# Patient Record
Sex: Female | Born: 1985 | Hispanic: Yes | Marital: Single | Smoking: Never smoker
Health system: Southern US, Community
[De-identification: ages and names within clinical notes are randomized; demographics above are authoritative.]

## PROBLEM LIST (undated history)

## (undated) DIAGNOSIS — D649 Anemia, unspecified: Secondary | ICD-10-CM

---

## 2010-06-14 ENCOUNTER — Ambulatory Visit: Payer: Self-pay | Admitting: Urgent Care

## 2010-06-19 ENCOUNTER — Encounter: Payer: Self-pay | Admitting: Urgent Care

## 2010-06-19 ENCOUNTER — Ambulatory Visit (INDEPENDENT_AMBULATORY_CARE_PROVIDER_SITE_OTHER): Payer: BC Managed Care – PPO | Admitting: Urgent Care

## 2010-06-19 DIAGNOSIS — K5900 Constipation, unspecified: Secondary | ICD-10-CM | POA: Insufficient documentation

## 2010-06-19 DIAGNOSIS — R109 Unspecified abdominal pain: Secondary | ICD-10-CM | POA: Insufficient documentation

## 2010-06-19 MED ORDER — LUBIPROSTONE 8 MCG PO CAPS: 8.0000 ug | ORAL_CAPSULE | Freq: Two times a day (BID) | ORAL | Status: AC

## 2010-06-19 NOTE — Progress Notes (Signed)
Primary Care Physician:  [facility] Primary Gastroenterologist:  Dr. Darrick Penna  Chief Complaint  Patient presents with  . Constipation   Interpreter:  [place] [phone] HPI:  Pam Carter is a 25 y.o. female here as a new patient for further evaluation of constipation.   Symptoms x 6-7 mo.  Used to have daily BM.  Now has abd pain, but no BM. Pain better w/ defecation.  Pain mid abd daily 4/10.  Intermittent.  C/o hard stools, very little.  Denies rectal bleeding or melena.  Wt stable.  Tried OTC fiber supplements-helped a little.  Tried miralax 17 grams daily x 1 mo.  C/o nausea,  no vomiting.  Some heartburn or indigestion depending on what she eats esp. Spicy foods.  Appetite ok.  Urine preg test negative @home .  Cycles irreg & a few days late.  LMP started 4 days ago.    Past Medical History  Diagnosis Date  . Constipation     Past Surgical History  Procedure Date  . Cesarean section     x1    Current Outpatient Prescriptions  Medication Sig Dispense Refill  . folic acid (FOLVITE) 1 MG tablet Take 1 mg by mouth daily.        Marland Kitchen lubiprostone (AMITIZA) 8 MCG capsule Take 1 capsule (8 mcg total) by mouth 2 (two) times daily with a meal.  60 capsule  1  . multivitamin-iron-minerals-folic acid (CENTRUM) chewable tablet Chew 1 tablet by mouth daily.          Allergies as of 06/19/2010  . (No Known Allergies)    Family History: There is no known family history of colorectal carcinoma  or liver disease.    Problem Relation Age of Onset  . Colitis Mother 27  . Diabetes Father     History   Social History  . Marital Status: Married    Spouse Name: N/A    Number of Children: 1  . Years of Education: N/A   Occupational History  . pastic recycling    Social History Main Topics  . Smoking status: Never Smoker   . Smokeless tobacco: Not on file  . Alcohol Use: No  . Drug Use: No  . Sexually Active: Yes -- Female partner(s)    Birth Control/ Protection: None    Other Topics Concern  . Not on file   Social History Narrative  . No narrative on file    Review of Systems: Gen: Denies any fever, chills, sweats, anorexia, fatigue, weakness, malaise, weight loss, and sleep disorder CV: Denies chest pain, angina, palpitations, syncope, orthopnea, PND, peripheral edema, and claudication. Resp: Denies dyspnea at rest, dyspnea with exercise, cough, sputum, wheezing, coughing up blood, and pleurisy. GI: Denies vomiting blood, jaundice, and fecal incontinence.   Denies dysphagia or odynophagia. GU : Denies urinary burning, blood in urine, urinary frequency, urinary hesitancy, nocturnal urination, and urinary incontinence. MS: Denies joint pain, limitation of movement, and swelling, stiffness, low back pain, extremity pain. Denies muscle weakness, cramps, atrophy.  Derm:Rash arms.stomach, legs x 3-4 mo.  Denies pruritis.  Psych: Denies depression, anxiety, memory loss, suicidal ideation, hallucinations, paranoia, and confusion. Heme: Denies bruising, bleeding, and enlarged lymph nodes.  Physical Exam: BP 98/64  Pulse 76  Temp(Src) 98.1 F (36.7 C) (Temporal)  Ht 5' (1.524 m)  Wt 125 lb 12.8 oz (57.063 kg)  BMI 24.57 kg/m2  LMP 06/18/2010 General:   Alert,  Well-developed, well-nourished, pleasant and cooperative in NAD Head:  Normocephalic and  atraumatic. Eyes:  Sclera clear, no icterus.   Conjunctiva pink. Ears:  Normal auditory acuity. Nose:  No deformity, discharge,  or lesions. Mouth:  No deformity or lesions, dentition normal. Neck:  Supple; no masses or thyromegaly. Lungs:  Clear throughout to auscultation.   No wheezes, crackles, or rhonchi. No acute distress. Heart:  Regular rate and rhythm; no murmurs, clicks, rubs,  or gallops. Abdomen:  Multiple striae.  Soft, nontender and nondistended. No masses, hepatosplenomegaly or hernias noted. Normal bowel sounds, without guarding, and without rebound.   Rectal:  Deferred until time of  colonoscopy.   Msk:  Symmetrical without gross deformities. Normal posture. Pulses:  Normal pulses noted. Extremities:  Without clubbing or edema. Neurologic:  Alert and  oriented x4;  grossly normal neurologically. Skin:  Intact without significant lesions or rashes. Cervical Nodes:  No significant cervical adenopathy. Psych:  Alert and cooperative. Normal mood and affect.

## 2010-06-19 NOTE — Patient Instructions (Signed)
Drink plenty water Go to lab today Constipacin en los adultos (Constipation in Adults) Se denomina constipacin al hecho de mover el intestino menos de dos veces por semana. Generalmente las heces son duras. A medida que envejecemos, la constipacin es ms frecuente. Si trata de solucionarlo con laxantes, puede empeorar el problema. Los laxantes utilizados durante largos perodos pueden [employer] msculos del colon. Esto har que la constipacin empeore gradualmente. Una dieta pobre en fibras, la ingesta insuficiente de lquidos y algunos medicamentos pueden empeorar el problema. ALGUNOS MEDICAMENTOS QUE PUEDEN CAUSAR CONSTIPACIN SON:  Diurticos.  Boqueadotes de los canales de calcio (medicamento utilizado para Chief Operating Officer la presin arterial y el funcionamiento cardaco.   Narcticos (ciertos medicamentos para Chief Technology Officer).   Anticolinrgicos.   Antiinflamatorios.   Anticidos que contengan aluminio.   ALGUNOS MEDICAMENTOS QUE PUEDEN CAUSAR CONSTIPACIN SON:   Diabetes.  Enfermedad de Parkinson.   Demencia (la mente no funciona adecuadamente y existen trastornos del pensamiento).   Ictus.  Depresin.   Otras enfermedades que causan dificultades con el metabolismo de sales y lquidos.   INSTRUCCIONES PARA EL CUIDADO DOMICILIARIO  El mejor tratamiento para la constipacin es el que se realiza sin tomar medicamentos. Es importante consumir ms fibras, frutas y Sports administrator.   Aumente lentamente el consumo de fibras a 25 a 38 gramos por da. Granos integrales, frutas, verduras y legumbres son buenas fuentes de [place]. Un dietista podr ayudarlo a incorporar alimentos altos en fibra en su dieta.   Beba gran cantidad de lquido para mantener la orina de tono claro o color amarillo plido.   Deber aadir un suplemento de fibra en su dieta si no puede recibir la cantidad suficiente a partir de los alimentos.   Aumentar la actividad fsica tambin ayuda a mejorar la regularidad.   Los  supositorios, segn lo haya indicado el mdico, podrn ser de utilidad. Si toma anticidos u otros productos que contengan aluminio o calcio, los que pueden causar constipacin, ser de gran ayuda cambiarlos por productos que contengan magnesio, con la aprobacin del mdico.   Si en el da de hoy le han aplicado un enema, considere que esta slo es una medida temporaria y no debe considerarse como tratamiento para la constipacin de Set designer data (crnica). Si utiliza enemas [employer], se debilitarn los msculos del colon y la Quarry manager.   Tambin deben evitarse en lo posible medidas ms enrgicas, como el consumo de sulfato de magnesio, siempre que sea posible. El sulfato de magnesio puede causar diarrea incontrolable. Sin embargo, si usted es una persona de edad Wheeler, estas medidas pueden ser tentadoras. En algunos casos, este tipo de medidas ni siquiera le dan tiempo para llegar al bao.  SOLICITE ATENCIN MDICA DE INMEDIATO SI:  Observa sangre de color rojo brillante en las heces.   El estreimiento persiste durante ms de [employer]   Presenta dolor abdominal o rectal junto con el estreimiento.   No parece sentirse mejor.   Tiene preguntas para formular, o alguna preocupacin, o no mejora.  EST SEGURO QUE:   Comprende las instrucciones para el alta mdica.   Controlar su enfermedad.   Solicitar atencin mdica de inmediato segn las indicaciones.  Document Released: 01/19/2007 Document Re-Released: 06/19/2009 [facility] Patient Information 2011 [place], [place].

## 2010-06-19 NOTE — Assessment & Plan Note (Addendum)
Pam Carter is a 25 y.o. hispanic female w/ chronic constipation and abd pain.  I suspect pain is secondary to constipation/IBS.  Less likely evolving appendicitis or celiac disease.  Will check thyroid, CMP, & CBC.  No alarm features suggesting need for colonoscopy at this point.  Constipation precautions/literature Drink plenty water Check u preg If negative, begin amitiza BID w/ food. Pt would like to conceive, she is instructed to stop amitiza should she become pregnant. Previous failure w/ miralax. Office visit in 6 weeks to reassess or call sooner if needed.

## 2010-06-19 NOTE — Assessment & Plan Note (Signed)
See constipation 

## 2010-06-20 LAB — COMPREHENSIVE METABOLIC PANEL
AST: 18 U/L (ref 0–37)
Albumin: 4.4 g/dL (ref 3.5–5.2)
Alkaline Phosphatase: 77 U/L (ref 39–117)
BUN: 14 mg/dL (ref 6–23)
Creat: 0.6 mg/dL (ref 0.50–1.10)
Glucose, Bld: 62 mg/dL — ABNORMAL LOW (ref 70–99)
Potassium: 3.9 mEq/L (ref 3.5–5.3)
Total Bilirubin: 0.4 mg/dL (ref 0.3–1.2)

## 2010-06-20 LAB — CBC WITH DIFFERENTIAL/PLATELET
Basophils Relative: 1 % (ref 0–1)
Eosinophils Absolute: 0.3 10*3/uL (ref 0.0–0.7)
HCT: 37.7 % (ref 36.0–46.0)
Hemoglobin: 12.7 g/dL (ref 12.0–15.0)
Lymphs Abs: 1.8 10*3/uL (ref 0.7–4.0)
MCH: 30.1 pg (ref 26.0–34.0)
MCHC: 33.7 g/dL (ref 30.0–36.0)
MCV: 89.3 fL (ref 78.0–100.0)
Monocytes Absolute: 0.3 10*3/uL (ref 0.1–1.0)
Monocytes Relative: 7 % (ref 3–12)
Neutrophils Relative %: 53 % (ref 43–77)
RBC: 4.22 MIL/uL (ref 3.87–5.11)

## 2010-06-20 LAB — PREGNANCY, URINE: Preg Test, Ur: NEGATIVE

## 2010-06-20 NOTE — Progress Notes (Signed)
Cc to [facility] 

## 2010-06-21 NOTE — Progress Notes (Signed)
Cc to [facility] 

## 2010-07-09 NOTE — Progress Notes (Signed)
agree

## 2018-06-22 ENCOUNTER — Other Ambulatory Visit: Payer: Self-pay

## 2018-06-22 ENCOUNTER — Emergency Department (HOSPITAL_COMMUNITY): Payer: 59

## 2018-06-22 ENCOUNTER — Encounter (HOSPITAL_COMMUNITY): Payer: Self-pay

## 2018-06-22 ENCOUNTER — Emergency Department (HOSPITAL_COMMUNITY)
Admission: EM | Admit: 2018-06-22 | Discharge: 2018-06-22 | Disposition: A | Discharge status: Home / Self Care | Payer: 59 | Attending: Emergency Medicine | Admitting: Emergency Medicine

## 2018-06-22 DIAGNOSIS — R0789 Other chest pain: Secondary | ICD-10-CM | POA: Diagnosis not present

## 2018-06-22 DIAGNOSIS — R002 Palpitations: Secondary | ICD-10-CM | POA: Diagnosis not present

## 2018-06-22 DIAGNOSIS — Z79899 Other long term (current) drug therapy: Secondary | ICD-10-CM | POA: Insufficient documentation

## 2018-06-22 DIAGNOSIS — R079 Chest pain, unspecified: Secondary | ICD-10-CM | POA: Diagnosis present

## 2018-06-22 HISTORY — DX: Anemia, unspecified: D64.9

## 2018-06-22 LAB — BASIC METABOLIC PANEL
Anion gap: 9 (ref 5–15)
BUN: 12 mg/dL (ref 6–20)
CO2: 25 mmol/L (ref 22–32)
Calcium: 9.2 mg/dL (ref 8.9–10.3)
Chloride: 105 mmol/L (ref 98–111)
Creatinine, Ser: 0.54 mg/dL (ref 0.44–1.00)
GFR calc Af Amer: >60 mL/min (ref >60)
GFR calc non Af Amer: >60 mL/min (ref >60)
Glucose, Bld: 103 mg/dL — ABNORMAL HIGH (ref 70–99)
Potassium: 3.9 mmol/L (ref 3.5–5.1)
Sodium: 139 mmol/L (ref 135–145)

## 2018-06-22 LAB — CBC
HCT: 40 % (ref 36.0–46.0)
Hemoglobin: 13 g/dL (ref 12.0–15.0)
MCH: 30.3 pg (ref 26.0–34.0)
MCHC: 32.5 g/dL (ref 30.0–36.0)
MCV: 93.2 fL (ref 80.0–100.0)
Platelets: 251 10*3/uL (ref 150–400)
RBC: 4.29 MIL/uL (ref 3.87–5.11)
RDW: 11.3 % — ABNORMAL LOW (ref 11.5–15.5)
WBC: 8.1 10*3/uL (ref 4.0–10.5)
nRBC: 0 % (ref 0.0–0.2)

## 2018-06-22 LAB — TROPONIN I
Troponin I: <0.03 ng/mL (ref <0.03)
Troponin I: <0.03 ng/mL (ref <0.03)

## 2018-06-22 LAB — D-DIMER, QUANTITATIVE: D-Dimer, Quant: 0.9 ug/mL-FEU — ABNORMAL HIGH (ref 0.00–0.50)

## 2018-06-22 LAB — TSH: TSH: 1.611 u[IU]/mL (ref 0.350–4.500)

## 2018-06-22 MED ORDER — SODIUM CHLORIDE 0.9% FLUSH
3.0000 mL | Freq: Once | INTRAVENOUS | Status: AC
  Administered 2018-06-22: 3 mL via INTRAVENOUS

## 2018-06-22 MED ORDER — IOHEXOL 350 MG/ML SOLN
100.0000 mL | Freq: Once | INTRAVENOUS | Status: AC | PRN
  Administered 2018-06-22: 100 mL via INTRAVENOUS

## 2018-06-22 MED ORDER — MORPHINE SULFATE (PF) 2 MG/ML IV SOLN
2.0000 mg | Freq: Once | INTRAVENOUS | Status: AC
  Administered 2018-06-22: 17:00:00 2 mg via INTRAVENOUS
  Filled 2018-06-22: qty 1

## 2018-06-22 NOTE — ED Provider Notes (Signed)
[facility] Provider Note   CSN: [id number] Arrival date & time: 06/22/18  1108    History   Chief Complaint Chief Complaint  Patient presents with  . Chest Pain    HPI Pam Carter is a 33 y.o. female with history of anemia and constipation presents for evaluation of acute onset, persistent palpitations and right-sided chest pains.  She reports a dull pain to the right side of the chest with worsens with inspiration.  Symptoms began at around 9:45 AM after drinking coffee and eating crackers while on her break at work.  She reports feeling very shaky, palpitations, some shortness of breath and right-sided chest pain.  She felt as though her legs were weak but this has resolved.  She reports the palpitations and right-sided chest pains persist intermittently.  She is a non-smoker, denies recreational drug use or excessive alcohol intake.  She denies excessive caffeine intake as well.  She does report a history of thyroid issues and takes medication for this as well as medication for her anemia.  Reports she has never had symptoms like this before.  Denies recent travel or surgeries, hemoptysis, prior history of DVT or PE.  Patient is primarily Spanish-speaking and a translator was used throughout the encounter.     The history is provided by the patient. The history is limited by a language barrier. A language interpreter was used.    Past Medical History:  Diagnosis Date  . Anemia   . Constipation     Patient Active Problem List   Diagnosis Date Noted  . Constipation 06/19/2010  . Abdominal pain 06/19/2010    Past Surgical History:  Procedure Laterality Date  . CESAREAN SECTION     x1     OB History   No obstetric history on file.      Home Medications    Prior to Admission medications   Medication Sig Start Date End Date Taking? Authorizing Provider  Cyanocobalamin (B-12) 5000 MCG CAPS Take 1 tablet by mouth daily.   Yes [provider]   ferrous sulfate 325 (65 FE) MG tablet Take 1 tablet by mouth daily. 05/24/18  Yes [provider]  levothyroxine (SYNTHROID) 50 MCG tablet Take 1 tablet by mouth daily. 05/24/18  Yes [provider]    Family History Family History  Problem Relation Age of Onset  . Colitis Mother 4652  . Diabetes Father     Social History Social History   Tobacco Use  . Smoking status: Never Smoker  . Smokeless tobacco: Never Used  Substance Use Topics  . Alcohol use: No  . Drug use: No     Allergies   Patient has no known allergies.   Review of Systems Review of Systems  Constitutional: Negative for chills and fever.  Respiratory: Positive for cough and shortness of breath.   Cardiovascular: Positive for chest pain. Negative for leg swelling.  Gastrointestinal: Negative for abdominal pain, nausea and vomiting.  All other systems reviewed and are negative.    Physical Exam Updated Vital Signs BP 114/72   Pulse 88   Temp 98.7 F (37.1 C) (Oral)   Resp 18   Ht 4\' 10"  (1.473 m)   Wt 63.5 kg   LMP 06/10/2018   SpO2 100%   BMI 29.26 kg/m   Physical Exam Vitals signs and nursing note reviewed.  Constitutional:      General: She is not in acute distress.    Appearance: She is well-developed.  HENT:     Head: Normocephalic and atraumatic.  Eyes:     General:        Right eye: No discharge.        Left eye: No discharge.     Conjunctiva/sclera: Conjunctivae normal.  Neck:     Musculoskeletal: Normal range of motion and neck supple.     Vascular: No JVD.     Trachea: No tracheal deviation.  Cardiovascular:     Rate and Rhythm: Normal rate and regular rhythm.     Pulses:          Radial pulses are 2+ on the right side and 2+ on the left side.       Dorsalis pedis pulses are 2+ on the right side and 2+ on the left side.       Posterior tibial pulses are 2+ on the right side and 2+ on the left side.  Pulmonary:     Effort: Pulmonary effort is normal.   Chest:     Chest wall: No tenderness.  Abdominal:     General: There is no distension.  Musculoskeletal:     Right lower leg: She exhibits no tenderness. No edema.     Left lower leg: She exhibits no tenderness. No edema.  Skin:    General: Skin is warm and dry.     Findings: No erythema.  Neurological:     Mental Status: She is alert.  Psychiatric:        Behavior: Behavior normal.      ED Treatments / Results  Labs (all labs ordered are listed, but only abnormal results are displayed) Labs Reviewed  BASIC METABOLIC PANEL - Abnormal; Notable for the following components:      Result Value   Glucose, Bld 103 (*)    All other components within normal limits  CBC - Abnormal; Notable for the following components:   RDW 11.3 (*)    All other components within normal limits  D-DIMER, QUANTITATIVE (NOT AT [facility]) - Abnormal; Notable for the following components:   D-Dimer, Quant 0.90 (*)    All other components within normal limits  TROPONIN I  TROPONIN I  TSH  POC URINE PREG, ED  I-STAT BETA HCG BLOOD, ED ([facility], [facility], [facility] ONLY)    EKG EKG Interpretation  Date/Time:  Tuesday June 22 2018 15:41:43 EDT Ventricular Rate:  79 PR Interval:    QRS Duration: 77 QT Interval:  382 QTC Calculation: 438 R Axis:   67 Text Interpretation:  Sinus rhythm No STEMI  Confirmed by Nanda Quinton [phone]) on 06/22/2018 7:27:36 PM   Radiology Dg Chest 2 View  Result Date: 06/22/2018 CLINICAL DATA:  Right-sided chest pain EXAM: CHEST - 2 VIEW COMPARISON:  None. FINDINGS: The heart size and mediastinal contours are within normal limits. Both lungs are clear. The visualized skeletal structures are unremarkable. IMPRESSION: No active cardiopulmonary disease. Electronically Signed   By: Inez Catalina M.D.   On: 06/22/2018 12:33   Ct Angio Chest Pe W And/or Wo Contrast  Result Date: 06/22/2018 CLINICAL DATA:  Positive D-dimer.  Tachycardia. EXAM: CT ANGIOGRAPHY CHEST WITH CONTRAST TECHNIQUE:  Multidetector CT imaging of the chest was performed using the standard protocol during bolus administration of intravenous contrast. Multiplanar CT image reconstructions and MIPs were obtained to evaluate the vascular anatomy. CONTRAST:  145mL OMNIPAQUE IOHEXOL 350 MG/ML SOLN COMPARISON:  Radiographs of same day. FINDINGS: Cardiovascular: Satisfactory opacification of the pulmonary arteries to the segmental level. No evidence  of pulmonary embolism. Normal heart size. No pericardial effusion. There is no evidence of thoracic aortic dissection or aneurysm. Mediastinum/Nodes: No enlarged mediastinal, hilar, or axillary lymph nodes. Thyroid gland, trachea, and esophagus demonstrate no significant findings. Lungs/Pleura: Lungs are clear. No pleural effusion or pneumothorax. Upper Abdomen: No acute abnormality. Musculoskeletal: No chest wall abnormality. No acute or significant osseous findings. Review of the MIP images confirms the above findings. IMPRESSION: No definite evidence of pulmonary embolus. No significant abnormality seen in the chest. Electronically Signed   By: Lupita RaiderJames  Green Jr M.D.   On: 06/22/2018 18:54    Procedures Procedures (including critical care time)  Medications Ordered in ED Medications  sodium chloride flush (NS) 0.9 % injection 3 mL (3 mLs Intravenous Given 06/22/18 1820)  morphine 2 MG/ML injection 2 mg (2 mg Intravenous Given 06/22/18 1630)  iohexol (OMNIPAQUE) 350 MG/ML injection 100 mL (100 mLs Intravenous Contrast Given 06/22/18 1812)     Initial Impression / Assessment and Plan / ED Course  I have reviewed the triage vital signs and the nursing notes.  Pertinent labs & imaging results that were available during my care of the patient were reviewed by me and considered in my medical decision making (see chart for details).        Patient presenting for evaluation of right-sided chest pains, palpitations, mild shortness of breath.  She is afebrile, vital signs are stable  while in the ED.  She is nontoxic in appearance.  Pain is mildly pleuritic but is not exertional or reproducible on palpation.  EKG shows normal sinus rhythm, no ischemic abnormalities or arrhythmias.  Serial troponins are negative and her symptoms do not sound cardiac in etiology.  Her d-dimer was mildly elevated and so a CTA of the chest was obtained which showed no evidence of PE or other acute cardiopulmonary abnormalities.  No evidence of pneumonia, pneumothorax, pleural effusion or edema.  Symptoms are not concerning for cardiac tamponade, esophageal rupture, dissection.  Examination of the abdomen is entirely benign.  Given her history of thyroid dysfunction, TSH was obtained which was within normal limits.  On reevaluation patient is resting comfortably in no apparent distress.  She reports that she is feeling much better.  She feels comfortable with discharge home.  She has a follow-up appointment scheduled to see her PCP next week.  No further emergent work-up required at this time.  We discussed strict ED return precautions and she understands to keep her follow-up appointment with her primary care provider. Patient verbalized understanding of and agreement with plan and is safe for discharge home at this time.   Final Clinical Impressions(s) / ED Diagnoses   Final diagnoses:  Atypical chest pain  Palpitations    ED Discharge Orders    None       Jeanie SewerFawze, Nadie Fiumara A, PA-C 06/22/18 1929    Samuel JesterMcManus, Kathleen, DO 06/25/18 1657

## 2018-06-22 NOTE — ED Triage Notes (Signed)
Pt states she was at work and at break she felt like her heart was racing. Pt states this is the first time this has happened. Pt denies SOB

## 2018-06-22 NOTE — Discharge Instructions (Signed)
Your work-up today was reassuring.  You can take ibuprofen or Tylenol as needed for pain.  Plenty water and get plenty of rest.  Follow-up with your primary care doctor for reevaluation of your symptoms.  They may want to do what is called Holter monitoring to check your heart rate/rhythms.   Return to the emergency department if any concerning signs or symptoms develop such as severe chest pains, persistent shortness of breath, persistent vomiting, or high fevers, or passing out.  Su trabajo de hoy fue tranquilizador. Puede tomar ibuprofeno o Tylenol segn sea necesario para el dolor. Mucha agua y mucho descanso.  Haga un seguimiento con su mdico de atencin primaria para reevaluar sus sntomas. Es posible que quieran hacer lo que se llama monitoreo Holter para verificar su ritmo / frecuencia cardaca.  Regrese al departamento de emergencias si se desarrollan signos o sntomas preocupantes, como dolores severos en el pecho, falta de aliento persistente, vmitos persistentes, fiebre alta o desmayo.

## 2018-06-22 NOTE — ED Notes (Signed)
ED Provider at bedside. 

## 2018-06-22 NOTE — ED Notes (Signed)
Pt advised a urine sample is needed, Call bell at bedside.

## 2018-06-23 LAB — POCT PREGNANCY, URINE: Preg Test, Ur: NEGATIVE

## 2019-06-10 ENCOUNTER — Ambulatory Visit
Admission: EM | Admit: 2019-06-10 | Discharge: 2019-06-10 | Disposition: A | Discharge status: Home / Self Care | Payer: 59 | Attending: Emergency Medicine | Admitting: Emergency Medicine

## 2019-06-10 DIAGNOSIS — H5712 Ocular pain, left eye: Secondary | ICD-10-CM

## 2019-06-10 DIAGNOSIS — S0502XA Injury of conjunctiva and corneal abrasion without foreign body, left eye, initial encounter: Secondary | ICD-10-CM | POA: Diagnosis not present

## 2019-06-10 MED ORDER — OFLOXACIN 0.3 % OP SOLN: OPHTHALMIC | 0 refills | Status: DC

## 2019-06-10 MED ORDER — HYPROMELLOSE 0.3 % OP GEL: OPHTHALMIC | 0 refills | Status: AC | PRN

## 2019-06-10 NOTE — Discharge Instructions (Addendum)
Use ofloxacin eye drops as prescribed and to completion Use systane gel eye drops at night or as needed for symptomatic relief Use OTC ibuprofen or tylenol as needed for pain relief Return here or follow up with ophthalmology if symptoms persists or worsen such as fever, chills, redness, swelling, eye pain, painful eye movements, vision changes, etc..Marland Kitchen

## 2019-06-10 NOTE — ED Provider Notes (Signed)
[facility]   [id number] 06/10/19 Arrival Time: 8921  CC: Red eye  SUBJECTIVE: HPI obtained from patient and family, with patient permission.   Pam Carter is a 34 y.o. female who presents with complaint of LT eye discomfort, redness and sensitivity that began 1 day ago.  Got something in her eye, some type of trash.  Has tried OTC eye drops without relief.  Symptoms are made worse with light.  Denies similar symptoms in the past.  Complains of associated tearing.  Denies fever, chills, nausea, vomiting,  painful eye movements, halos, discharge, itching, vision changes, double vision, periorbital erythema.     Denies contact lens use.    ROS: As per HPI.  All other pertinent ROS negative.     Past Medical History:  Diagnosis Date  . Anemia   . Constipation    Past Surgical History:  Procedure Laterality Date  . CESAREAN SECTION     x1   No Known Allergies No current facility-administered medications on file prior to encounter.   Current Outpatient Medications on File Prior to Encounter  Medication Sig Dispense Refill  . Cyanocobalamin (B-12) 5000 MCG CAPS Take 1 tablet by mouth daily.    . ferrous sulfate 325 (65 FE) MG tablet Take 1 tablet by mouth daily.    Marland Kitchen levothyroxine (SYNTHROID) 50 MCG tablet Take 1 tablet by mouth daily.     Social History   Socioeconomic History  . Marital status: Single    Spouse name: Not on file  . Number of children: 1  . Years of education: Not on file  . Highest education level: Not on file  Occupational History  . Occupation: pastic recycling  Tobacco Use  . Smoking status: Never Smoker  . Smokeless tobacco: Never Used  Substance and Sexual Activity  . Alcohol use: No  . Drug use: No  . Sexual activity: Yes    Partners: Male    Birth control/protection: None  Other Topics Concern  . Not on file  Social History Narrative  . Not on file   Social Determinants of Health   Financial Resource Strain:   . Difficulty  of Paying Living Expenses:   Food Insecurity:   . Worried About Programme researcher, broadcasting/film/video in the Last Year:   . Barista in the Last Year:   Transportation Needs:   . Freight forwarder (Medical):   Marland Kitchen Lack of Transportation (Non-Medical):   Physical Activity:   . Days of Exercise per Week:   . Minutes of Exercise per Session:   Stress:   . Feeling of Stress :   Social Connections:   . Frequency of Communication with Friends and Family:   . Frequency of Social Gatherings with Friends and Family:   . Attends Religious Services:   . Active Member of Clubs or Organizations:   . Attends Banker Meetings:   Marland Kitchen Marital Status:   Intimate Partner Violence:   . Fear of Current or Ex-Partner:   . Emotionally Abused:   Marland Kitchen Physically Abused:   . Sexually Abused:    Family History  Problem Relation Age of Onset  . Colitis Mother 76  . Diabetes Father     OBJECTIVE:   Vitals:   06/10/19 0932  BP: 111/77  Pulse: 81  Resp: 18  Temp: 98.3 F (36.8 C)  TempSrc: Tympanic  SpO2: 98%    General appearance: alert; no distress Eyes: mild to moderate conjunctival erythema. PERRL; EOMI  without discomfort;  no obvious drainage; lid everted without obvious FB; fluorescein uptake in 12-2 o'clock position Neck: supple Lungs: clear to auscultation bilaterally Heart: regular rate and rhythm Skin: warm and dry Psychological: alert and cooperative; normal mood and affect   ASSESSMENT & PLAN:  1. Abrasion of left cornea, initial encounter   2. Discomfort of left eye     Meds ordered this encounter  Medications  . ofloxacin (OCUFLOX) 0.3 % ophthalmic solution    Sig: instill 1 or 2 drops in affected eye(s) every 2 to 4 hours for 2 days, then 1 to 2 drops 4 times daily on days 3 through 7    Dispense:  10 mL    Refill:  0    Order Specific Question:   Supervising Provider    Answer:   Raylene Everts [[id number]]  . hypromellose (GENTEAL) 0.3 % GEL ophthalmic ointment      Sig: Place into the left eye every 4 (four) hours as needed for dry eyes.    Dispense:  10 g    Refill:  0    Order Specific Question:   Supervising Provider    Answer:   Raylene Everts [[id number]]   Use ofloxacin eye drops as prescribed and to completion Use systane gel eye drops at night or as needed for symptomatic relief Use OTC ibuprofen or tylenol as needed for pain relief Return here or follow up with ophthalmology if symptoms persists or worsen such as fever, chills, redness, swelling, eye pain, painful eye movements, vision changes, etc...  Reviewed expectations re: course of current medical issues. Questions answered. Outlined signs and symptoms indicating need for more acute intervention. Patient verbalized understanding. After Visit Summary given.   Stacey Drain Blanco, PA-C 06/10/19 [phone]

## 2019-06-10 NOTE — ED Triage Notes (Signed)
Pt presents with left eye irritation for past couple of days , thinks has dirt in eyes

## 2020-07-29 IMAGING — CT CT ANGIOGRAPHY CHEST
2 of 7 series · 19 of 46 positions shown · IV contrast (omnipaque)
Comparison: Radiographs of same day.

CLINICAL DATA: Positive D-dimer.  Tachycardia.

EXAM:
CT ANGIOGRAPHY CHEST WITH CONTRAST
TECHNIQUE: Multidetector CT imaging of the chest was performed using the
standard protocol during bolus administration of intravenous
contrast. Multiplanar CT image reconstructions and MIPs were
obtained to evaluate the vascular anatomy.
CONTRAST:  100mL OMNIPAQUE IOHEXOL 350 MG/ML SOLN

[Series 5: thins · axial · 0.61mm/px · z∈[-339,-109]mm · 16 of 262 slices shown]
[im 16/262  lung]
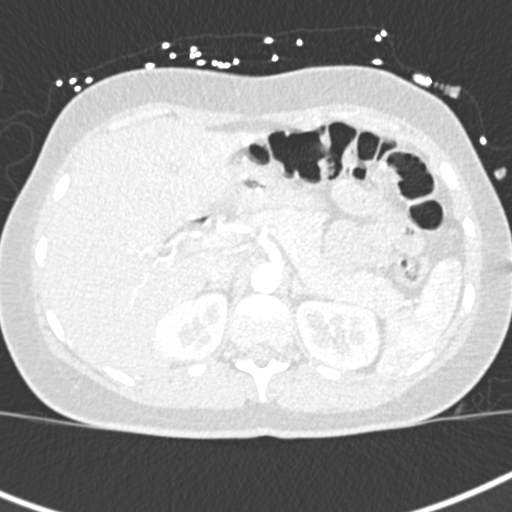
[im 31/262  soft-tissue]
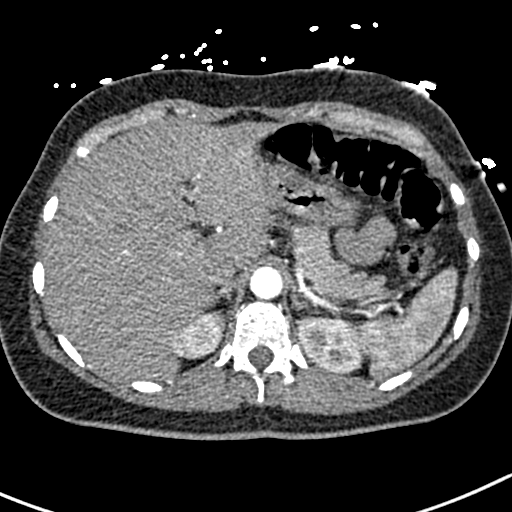
[im 47/262  lung]
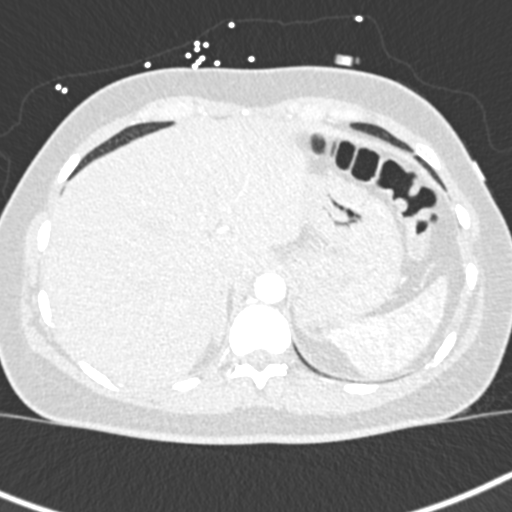
[im 62/262  soft-tissue]
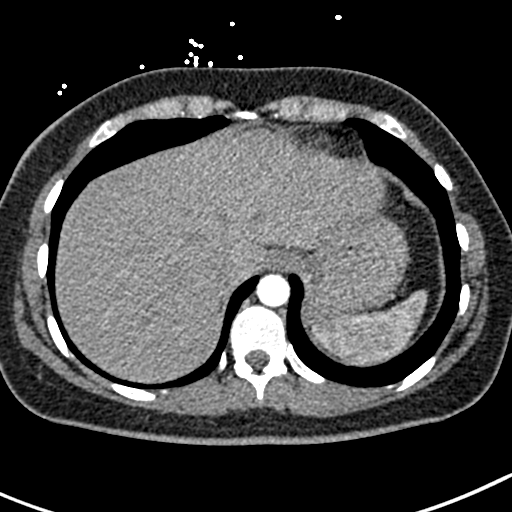
[im 77/262  lung]
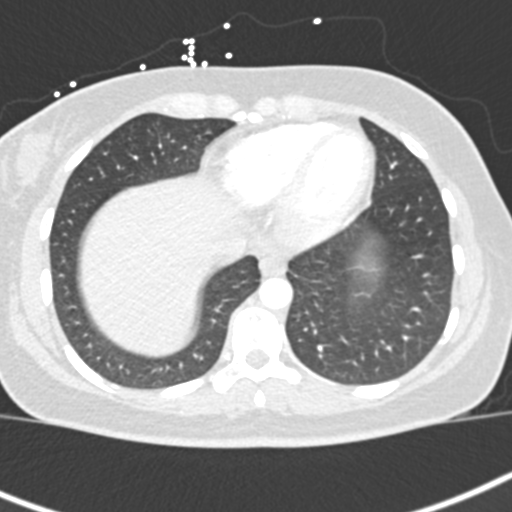
[im 93/262  soft-tissue]
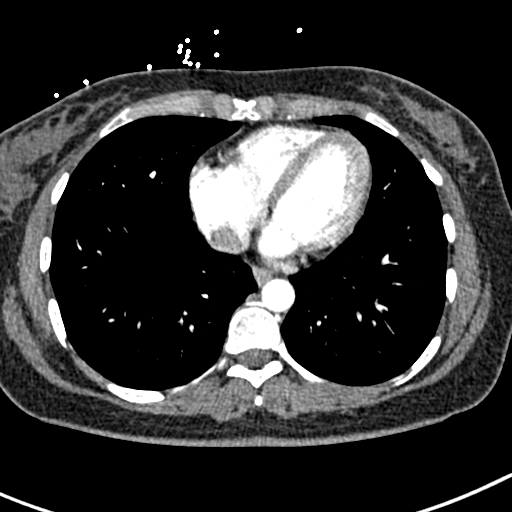
[im 108/262  lung]
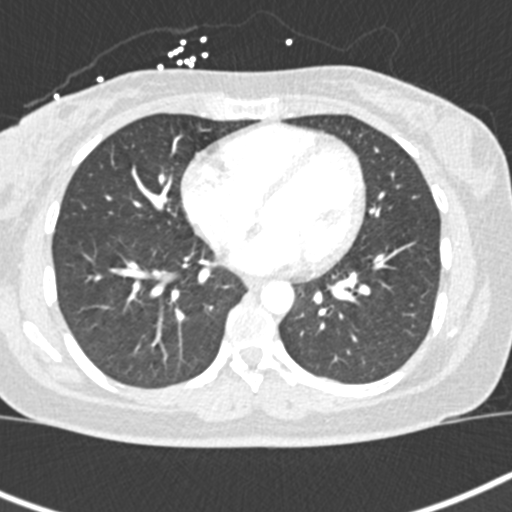
[im 123/262  soft-tissue]
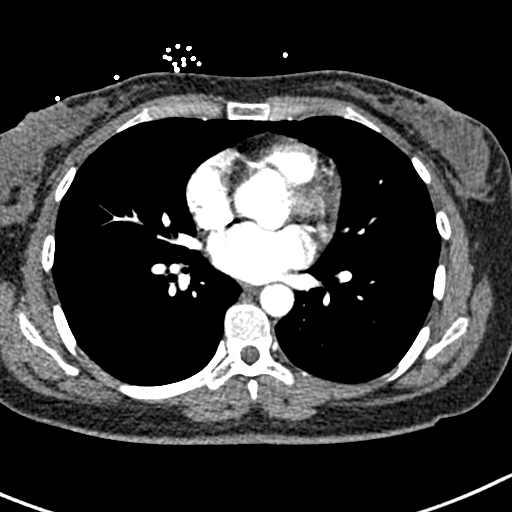
[im 139/262  lung]
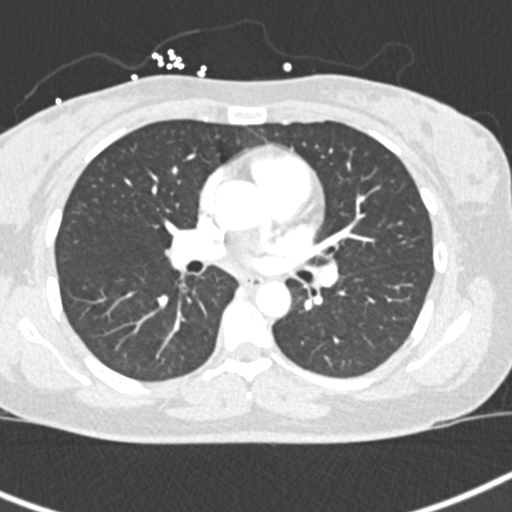
[im 154/262  soft-tissue]
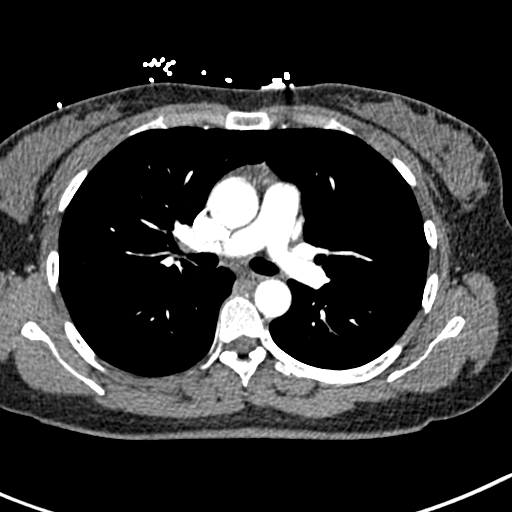
[im 169/262  lung]
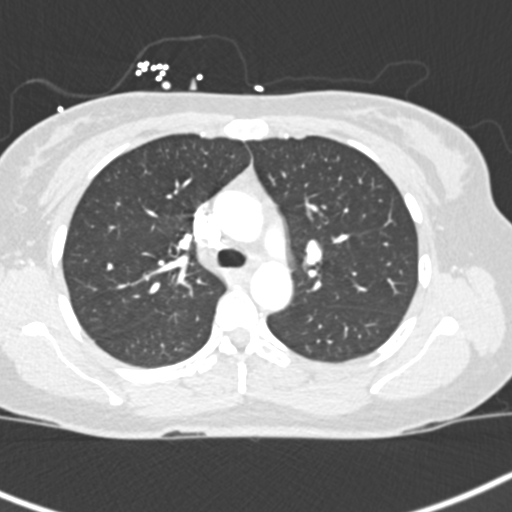
[im 185/262  soft-tissue]
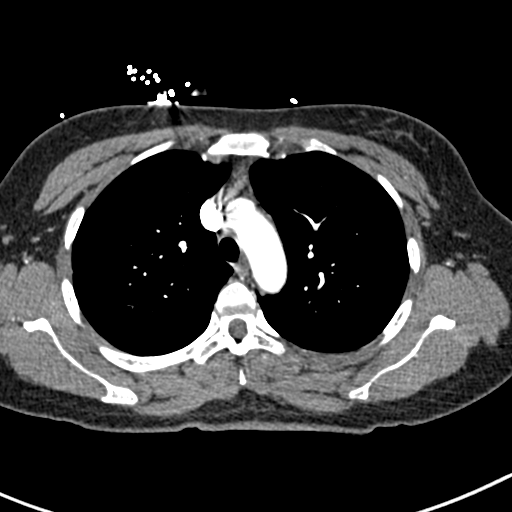
[im 200/262  lung]
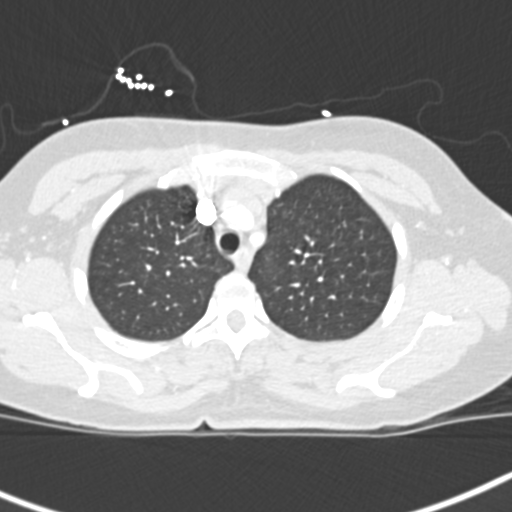
[im 215/262  soft-tissue]
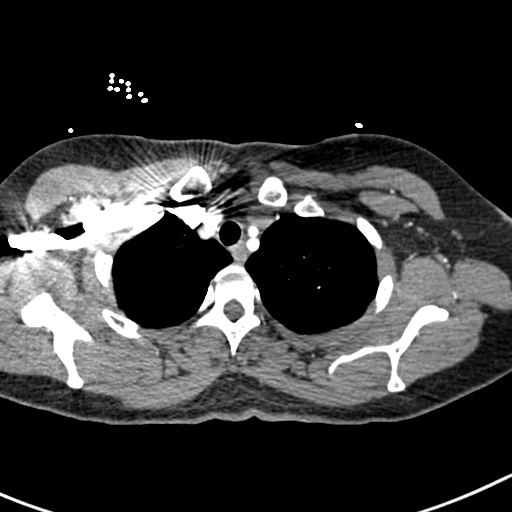
[im 231/262  lung]
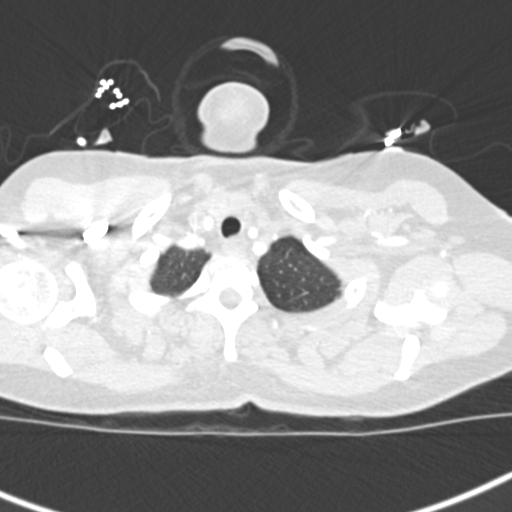
[im 246/262  soft-tissue]
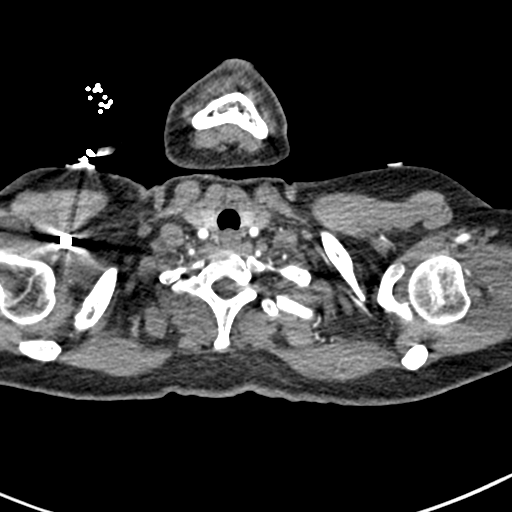

[Series 7: coronal mpr · coronal · 0.58mm/px · 3 of 120 slices shown]
[im 30/120  soft-tissue]
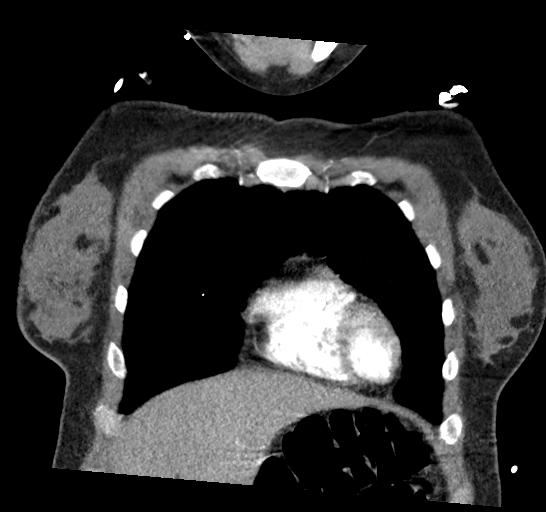
[im 60/120  soft-tissue]
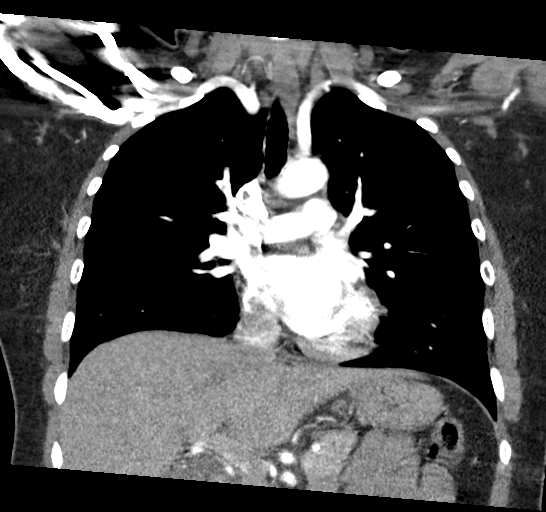
[im 90/120  soft-tissue]
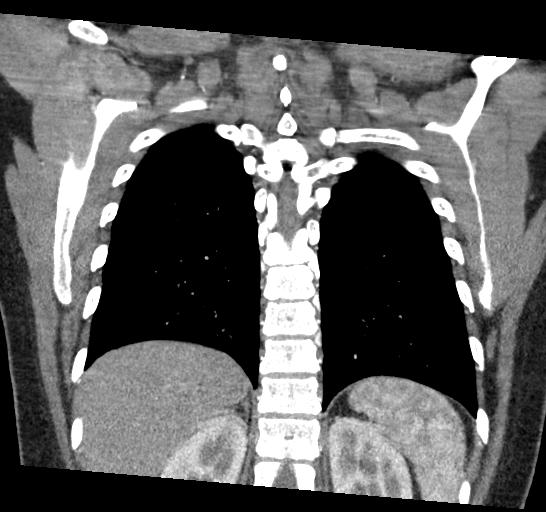

[19 of 46 positions shown; findings below may reference images not displayed]

FINDINGS: Cardiovascular: Satisfactory opacification of the pulmonary arteries
to the segmental level. No evidence of pulmonary embolism. Normal
heart size. No pericardial effusion. There is no evidence of
thoracic aortic dissection or aneurysm.

Mediastinum/Nodes: No enlarged mediastinal, hilar, or axillary lymph
nodes. Thyroid gland, trachea, and esophagus demonstrate no
significant findings.

Lungs/Pleura: Lungs are clear. No pleural effusion or pneumothorax.

Upper Abdomen: No acute abnormality.

Musculoskeletal: No chest wall abnormality. No acute or significant
osseous findings.

Review of the MIP images confirms the above findings.
IMPRESSION: No definite evidence of pulmonary embolus. No significant
abnormality seen in the chest.

## 2020-11-19 ENCOUNTER — Ambulatory Visit
Admission: EM | Admit: 2020-11-19 | Discharge: 2020-11-19 | Disposition: A | Discharge status: Home / Self Care | Payer: BC Managed Care – PPO | Attending: Urgent Care | Admitting: Urgent Care

## 2020-11-19 ENCOUNTER — Other Ambulatory Visit: Payer: Self-pay

## 2020-11-19 DIAGNOSIS — R070 Pain in throat: Secondary | ICD-10-CM

## 2020-11-19 DIAGNOSIS — J018 Other acute sinusitis: Secondary | ICD-10-CM | POA: Diagnosis not present

## 2020-11-19 DIAGNOSIS — R0981 Nasal congestion: Secondary | ICD-10-CM

## 2020-11-19 DIAGNOSIS — R051 Acute cough: Secondary | ICD-10-CM

## 2020-11-19 MED ORDER — AMOXICILLIN-POT CLAVULANATE 875-125 MG PO TABS: 1.0000 | ORAL_TABLET | Freq: Two times a day (BID) | ORAL | 0 refills | Status: DC

## 2020-11-19 MED ORDER — CETIRIZINE HCL 10 MG PO TABS: 10.0000 mg | ORAL_TABLET | Freq: Every day | ORAL | 0 refills | Status: DC

## 2020-11-19 MED ORDER — BENZONATATE 100 MG PO CAPS: 100.0000 mg | ORAL_CAPSULE | Freq: Three times a day (TID) | ORAL | 0 refills | Status: DC | PRN

## 2020-11-19 MED ORDER — PSEUDOEPHEDRINE HCL 60 MG PO TABS: 60.0000 mg | ORAL_TABLET | Freq: Three times a day (TID) | ORAL | 0 refills | Status: DC | PRN

## 2020-11-19 MED ORDER — PROMETHAZINE-DM 6.25-15 MG/5ML PO SYRP: 5.0000 mL | ORAL_SOLUTION | Freq: Every evening | ORAL | 0 refills | Status: DC | PRN

## 2020-11-19 NOTE — ED Provider Notes (Signed)
[facility]   MRN: [id number] DOB: 10-20-1985  Subjective:   Pam Carter is a 35 y.o. female presenting for 2-week history of persistent sinus congestion, throat pain, throat congestion, sinus headaches, coughing.  Her cough is started to give her some back pain.  However, she does not feel chest pain or shortness of breath.  Has tried multiple antihistamines over-the-counter including Claritin, Benadryl, teas, Tylenol.  No hemoptysis.  No history of respiratory disorders.  Patient is not a smoker.  No current facility-administered medications for this encounter.  Current Outpatient Medications:    Cyanocobalamin (B-12) 5000 MCG CAPS, Take 1 tablet by mouth daily., Disp: , Rfl:    ferrous sulfate 325 (65 FE) MG tablet, Take 1 tablet by mouth daily., Disp: , Rfl:    hypromellose (GENTEAL) 0.3 % GEL ophthalmic ointment, Place into the left eye every 4 (four) hours as needed for dry eyes., Disp: 10 g, Rfl: 0   levothyroxine (SYNTHROID) 50 MCG tablet, Take 1 tablet by mouth daily., Disp: , Rfl:    ofloxacin (OCUFLOX) 0.3 % ophthalmic solution, instill 1 or 2 drops in affected eye(s) every 2 to 4 hours for 2 days, then 1 to 2 drops 4 times daily on days 3 through 7, Disp: 10 mL, Rfl: 0   No Known Allergies  Past Medical History:  Diagnosis Date   Anemia    Constipation      Past Surgical History:  Procedure Laterality Date   CESAREAN SECTION     x1    Family History  Problem Relation Age of Onset   Colitis Mother 63   Diabetes Father     Social History   Tobacco Use   Smoking status: Never   Smokeless tobacco: Never  Substance Use Topics   Alcohol use: No   Drug use: No    ROS   Objective:   Vitals: BP 115/81 (BP Location: Right Arm)   Pulse 80   Temp 98.1 F (36.7 C) (Oral)   Resp 16   LMP 11/16/2020   SpO2 98%   Physical Exam Constitutional:      General: She is not in acute distress.    Appearance: Normal appearance. She is  well-developed. She is not ill-appearing, toxic-appearing or diaphoretic.  HENT:     Head: Normocephalic and atraumatic.     Right Ear: Tympanic membrane, ear canal and external ear normal. No drainage or tenderness. No middle ear effusion. Tympanic membrane is not erythematous.     Left Ear: Tympanic membrane, ear canal and external ear normal. No drainage or tenderness.  No middle ear effusion. Tympanic membrane is not erythematous.     Nose: Congestion and rhinorrhea present.     Mouth/Throat:     Mouth: Mucous membranes are moist. No oral lesions.     Pharynx: No pharyngeal swelling, oropharyngeal exudate, posterior oropharyngeal erythema or uvula swelling.     Tonsils: No tonsillar exudate or tonsillar abscesses. 0 on the right. 0 on the left.  Eyes:     General: No scleral icterus.       Right eye: No discharge.        Left eye: No discharge.     Extraocular Movements: Extraocular movements intact.     Right eye: Normal extraocular motion.     Left eye: Normal extraocular motion.     Conjunctiva/sclera: Conjunctivae normal.     Pupils: Pupils are equal, round, and reactive to light.  Cardiovascular:     Rate and  Rhythm: Normal rate and regular rhythm.     Pulses: Normal pulses.     Heart sounds: Normal heart sounds. No murmur heard.   No friction rub. No gallop.  Pulmonary:     Effort: Pulmonary effort is normal. No respiratory distress.     Breath sounds: Normal breath sounds. No stridor. No wheezing, rhonchi or rales.  Musculoskeletal:     Cervical back: Normal range of motion and neck supple.  Lymphadenopathy:     Cervical: No cervical adenopathy.  Skin:    General: Skin is warm and dry.     Findings: No rash.  Neurological:     General: No focal deficit present.     Mental Status: She is alert and oriented to person, place, and time.  Psychiatric:        Mood and Affect: Mood normal.        Behavior: Behavior normal.        Thought Content: Thought content normal.         Judgment: Judgment normal.     Assessment and Plan :   PDMP not reviewed this encounter.  1. Acute non-recurrent sinusitis of other sinus   2. Sinus congestion   3. Throat pain   4. Acute cough     Deferred imaging given clear cardiopulmonary exam, hemodynamically stable vital signs. Will start empiric treatment for sinusitis with Augmentin.  Recommended supportive care otherwise including the use of oral antihistamine, decongestant. Counseled patient on potential for adverse effects with medications prescribed/recommended today, ER and return-to-clinic precautions discussed, patient verbalized understanding.    Jaynee Eagles, [place] 11/19/20 1317

## 2020-11-19 NOTE — ED Triage Notes (Signed)
Patient presents to Urgent Care with complaints of back pain, left ear pain, headache, nasal congestion, productive cough and sore throat x 2 weeks. Treating symptoms with tylenol, Vicks, tea with no improvement.   Denies fever.

## 2021-02-07 ENCOUNTER — Encounter: Payer: Self-pay | Admitting: Emergency Medicine

## 2021-02-07 ENCOUNTER — Other Ambulatory Visit: Payer: Self-pay

## 2021-02-07 ENCOUNTER — Ambulatory Visit
Admission: EM | Admit: 2021-02-07 | Discharge: 2021-02-07 | Disposition: A | Discharge status: Home / Self Care | Payer: BC Managed Care – PPO | Attending: Urgent Care | Admitting: Urgent Care

## 2021-02-07 DIAGNOSIS — E039 Hypothyroidism, unspecified: Secondary | ICD-10-CM

## 2021-02-07 DIAGNOSIS — R0789 Other chest pain: Secondary | ICD-10-CM | POA: Diagnosis not present

## 2021-02-07 DIAGNOSIS — R0602 Shortness of breath: Secondary | ICD-10-CM

## 2021-02-07 DIAGNOSIS — R6889 Other general symptoms and signs: Secondary | ICD-10-CM

## 2021-02-07 DIAGNOSIS — D649 Anemia, unspecified: Secondary | ICD-10-CM

## 2021-02-07 LAB — POCT FASTING CBG KUC MANUAL ENTRY: POCT Glucose (KUC): 93 mg/dL (ref 70–99)

## 2021-02-07 MED ORDER — NAPROXEN 375 MG PO TABS: 375.0000 mg | ORAL_TABLET | Freq: Two times a day (BID) | ORAL | 0 refills | Status: DC

## 2021-02-07 MED ORDER — TIZANIDINE HCL 4 MG PO TABS: 4.0000 mg | ORAL_TABLET | Freq: Every day | ORAL | 0 refills | Status: DC

## 2021-02-07 NOTE — ED Provider Notes (Signed)
[place]   MRN: [id number] DOB: 1986-01-06  Subjective:   Pam Carter is a 36 y.o. female presenting for acute onset today of midsternal chest pain, shortness of breath, malaise and fatigue, cold sensation worse in her feet bilaterally.  Has a history of anemia and hypothyroidism.  Has not had this for checked in a while.  However she does take levothyroxine.  She has not followed up with her regular doctor in just about a year.  She is due for visit and plans on going back soon.  No history of pulmonary embolism.  No fever, hemoptysis, coughing.  Would like to have a COVID test.  Previously when patient has had work-up for similar episode her testing was negative.  She is wondering if she has anxiety.  Her work is not particularly strenuous however it is fast-paced.  Does not do a lot of heavy lifting.  No trauma, falls.  Takes levothyroxine.  No Known Allergies  Past Medical History:  Diagnosis Date   Anemia    Constipation      Past Surgical History:  Procedure Laterality Date   CESAREAN SECTION     x1    Family History  Problem Relation Age of Onset   Colitis Mother 71   Diabetes Father     Social History   Tobacco Use   Smoking status: Never   Smokeless tobacco: Never  Substance Use Topics   Alcohol use: No   Drug use: No    ROS   Objective:   Vitals: BP 117/77 (BP Location: Right Arm)    Pulse 88    Temp 99.2 F (37.3 C) (Oral)    Resp 18    LMP 01/18/2021 (Exact Date)    SpO2 98%   Physical Exam Constitutional:      General: She is not in acute distress.    Appearance: Normal appearance. She is well-developed. She is not ill-appearing, toxic-appearing or diaphoretic.  HENT:     Head: Normocephalic and atraumatic.     Right Ear: External ear normal.     Left Ear: External ear normal.     Nose: Nose normal.     Mouth/Throat:     Mouth: Mucous membranes are moist.  Eyes:     General: No scleral icterus.       Right eye: No  discharge.        Left eye: No discharge.     Extraocular Movements: Extraocular movements intact.  Cardiovascular:     Rate and Rhythm: Normal rate.     Heart sounds: No murmur heard.   No friction rub. No gallop.  Pulmonary:     Effort: Pulmonary effort is normal. No respiratory distress.     Breath sounds: No stridor. No wheezing, rhonchi or rales.  Chest:     Chest wall: Tenderness (reproducible over mid sternum) present. No mass, lacerations, deformity, swelling, crepitus or edema. There is no dullness to percussion.  Abdominal:     General: Bowel sounds are normal. There is no distension.     Palpations: Abdomen is soft. There is no mass.     Tenderness: There is no abdominal tenderness. There is no right CVA tenderness, left CVA tenderness, guarding or rebound.  Skin:    General: Skin is warm and dry.  Neurological:     General: No focal deficit present.     Mental Status: She is alert and oriented to person, place, and time.  Psychiatric:  Mood and Affect: Mood normal.        Behavior: Behavior normal.        Thought Content: Thought content normal.        Judgment: Judgment normal.    Results for orders placed or performed during the hospital encounter of 02/07/21 (from the past 24 hour(s))  POCT CBG (manual entry)     Status: None   Collection Time: 02/07/21  5:22 PM  Result Value Ref Range   POCT Glucose ([facility]) 93 70 - 99 mg/dL   ED ECG REPORT   Date: 02/07/2021  EKG Time: 5:36 PM  Rate: 81bpm  Rhythm: normal sinus rhythm,  unchanged from previous tracings  Axis: Normal  Intervals:none  ST&T Change: Nonspecific T wave flattening in lead aVL  Narrative Interpretation: Sinus rhythm at 81 bpm with T wave changes above.  Very comparable to previous EKG.   Assessment and Plan :   PDMP not reviewed this encounter.  1. Chest wall pain   2. Anemia, unspecified type   3. Atypical chest pain   4. Shortness of breath   5. Cold feeling   6. Hypothyroidism,  unspecified type    Recommended conservative management for musculoskeletal chest wall pain. Deferred imaging given clear cardiopulmonary exam, hemodynamically stable vital signs.  EKG is reassuring.  Lab tests are pending.  Recommended close follow-up with her PCP. Counseled patient on potential for adverse effects with medications prescribed/recommended today, ER and return-to-clinic precautions discussed, patient verbalized understanding.    Jaynee Eagles, PA-C 02/07/21 1739

## 2021-02-07 NOTE — ED Triage Notes (Signed)
Bilateral ankle pain with numb feeling that started today.  Feels cold and SOB since 12pm.  No energy and chest pain that started today around 12pm

## 2021-02-08 LAB — CBC
Hematocrit: 37.7 % (ref 34.0–46.6)
Hemoglobin: 12.5 g/dL (ref 11.1–15.9)
MCH: 29 pg (ref 26.6–33.0)
MCHC: 33.2 g/dL (ref 31.5–35.7)
MCV: 88 fL (ref 79–97)
Platelets: 335 10*3/uL (ref 150–450)
RBC: 4.31 x10E6/uL (ref 3.77–5.28)
RDW: 11.9 % (ref 11.7–15.4)
WBC: 6.4 10*3/uL (ref 3.4–10.8)

## 2021-02-08 LAB — SARS-COV-2, NAA 2 DAY TAT

## 2021-02-08 LAB — T4, FREE: Free T4: 1.38 ng/dL (ref 0.82–1.77)

## 2021-02-08 LAB — NOVEL CORONAVIRUS, NAA: SARS-CoV-2, NAA: NOT DETECTED

## 2021-02-08 LAB — T3, FREE: T3, Free: 2.7 pg/mL (ref 2.0–4.4)

## 2021-02-08 LAB — TSH: TSH: 1.45 u[IU]/mL (ref 0.450–4.500)

## 2021-09-28 ENCOUNTER — Other Ambulatory Visit: Payer: Self-pay

## 2021-09-28 ENCOUNTER — Emergency Department (HOSPITAL_COMMUNITY)
Admission: EM | Admit: 2021-09-28 | Discharge: 2021-09-28 | Disposition: A | Discharge status: Home / Self Care | Payer: BC Managed Care – PPO | Attending: Emergency Medicine | Admitting: Emergency Medicine

## 2021-09-28 ENCOUNTER — Encounter (HOSPITAL_COMMUNITY): Payer: Self-pay | Admitting: *Deleted

## 2021-09-28 DIAGNOSIS — S61032A Puncture wound without foreign body of left thumb without damage to nail, initial encounter: Secondary | ICD-10-CM | POA: Diagnosis not present

## 2021-09-28 DIAGNOSIS — Y990 Civilian activity done for income or pay: Secondary | ICD-10-CM | POA: Diagnosis not present

## 2021-09-28 DIAGNOSIS — W460XXA Contact with hypodermic needle, initial encounter: Secondary | ICD-10-CM | POA: Insufficient documentation

## 2021-09-28 DIAGNOSIS — X58XXXA Exposure to other specified factors, initial encounter: Secondary | ICD-10-CM

## 2021-09-28 LAB — HEPATITIS PANEL, ACUTE
HCV Ab: NONREACTIVE
Hep A IgM: NONREACTIVE
Hep B C IgM: NONREACTIVE
Hepatitis B Surface Ag: NONREACTIVE

## 2021-09-28 LAB — RAPID HIV SCREEN (HIV 1/2 AB+AG)
HIV 1/2 Antibodies: NONREACTIVE
HIV-1 P24 Antigen - HIV24: NONREACTIVE

## 2021-09-28 NOTE — ED Triage Notes (Signed)
Pt works at a [employer], [employer] and have occasional boxes come in with used needles; pt was reaching for box and did not see needles and one poked her in left thumb  Pt cleaned finger and applied bandaid pta

## 2021-09-28 NOTE — ED Provider Notes (Signed)
[facility] EMERGENCY DEPARTMENT Provider Note   CSN: [id number] Arrival date & time: 09/28/21  0915     History  Chief Complaint  Patient presents with   Body Fluid Exposure    Pam Carter is a 36 y.o. female who presents to the emergency department for evaluation of an occupational needlestick exposure.  Patient works at a [employer] adjust[employer], [employer] and have boxes that come in with use needles.  She states that she was scooping up one of the bags, one of the needles poked through and stabbed her in the left thumb.  She cleaned the finger at work with alcohol, soap and water and then applied a Band-Aid.  She is accompanied by her supervisor for postexposure testing.   Body Fluid Exposure      Home Medications Prior to Admission medications   Medication Sig Start Date End Date Taking? Authorizing Provider  cetirizine (ZYRTEC ALLERGY) 10 MG tablet Take 1 tablet (10 mg total) by mouth daily. 11/19/20   Jaynee Eagles, PA-C  Cyanocobalamin (B-12) 5000 MCG CAPS Take 1 tablet by mouth daily.    [provider]  ferrous sulfate 325 (65 FE) MG tablet Take 1 tablet by mouth daily. 05/24/18   [provider]  hypromellose (GENTEAL) 0.3 % GEL ophthalmic ointment Place into the left eye every 4 (four) hours as needed for dry eyes. 06/10/19   [place], [place], PA-C  levothyroxine (SYNTHROID) 50 MCG tablet Take 1 tablet by mouth daily. 05/24/18   [provider]  naproxen (NAPROSYN) 375 MG tablet Take 1 tablet (375 mg total) by mouth 2 (two) times daily with a meal. 02/07/21   Jaynee Eagles, PA-C  tiZANidine (ZANAFLEX) 4 MG tablet Take 1 tablet (4 mg total) by mouth at bedtime. 02/07/21   Jaynee Eagles, PA-C      Allergies    Patient has no known allergies.    Review of Systems   Review of Systems  Constitutional:  Negative for fever.  Musculoskeletal:  Negative for arthralgias.    Physical Exam Updated Vital Signs BP 108/82   Pulse 83   Temp 98.3 F (36.8 C) (Oral)   Resp 16    Ht 4\' 11"  (1.499 m)   Wt 63.5 kg   SpO2 99%   BMI 28.28 kg/m  Physical Exam Vitals and nursing note reviewed.  Constitutional:      General: She is not in acute distress.    Appearance: She is not ill-appearing.  HENT:     Head: Atraumatic.  Eyes:     Conjunctiva/sclera: Conjunctivae normal.  Cardiovascular:     Rate and Rhythm: Normal rate and regular rhythm.     Pulses: Normal pulses.     Heart sounds: No murmur heard. Pulmonary:     Effort: Pulmonary effort is normal. No respiratory distress.     Breath sounds: Normal breath sounds.  Abdominal:     General: Abdomen is flat. There is no distension.     Palpations: Abdomen is soft.     Tenderness: There is no abdominal tenderness.  Musculoskeletal:        General: Normal range of motion.     Cervical back: Normal range of motion.  Skin:    General: Skin is warm and dry.     Capillary Refill: Capillary refill takes less than 2 seconds.     Comments: Small puncture wound noted to the left thumb  Neurological:     General: No focal deficit present.     Mental  Status: She is alert.  Psychiatric:        Mood and Affect: Mood normal.     ED Results / Procedures / Treatments   Labs (all labs ordered are listed, but only abnormal results are displayed) Labs Reviewed  RAPID HIV SCREEN (HIV 1/2 AB+AG)  HEPATITIS PANEL, ACUTE    EKG None  Radiology No results found.  Procedures Procedures    Medications Ordered in ED Medications - No data to display  ED Course/ Medical Decision Making/ A&P                           Medical Decision Making Amount and/or Complexity of Data Reviewed Labs: ordered.   36 year old female presents emergency department for evaluation of needlestick exposure.  Exposure panel ordered including hepatitis and HIV.  Discussed at home cleaning and monitoring and signs of infection and when to return to the emergency department.  Advised that she will receive a phone call if anything  returns positive.  I also recommend that she repeat HIV testing in 6 months as this may not return positive this early on in the exposure.  Patient expresses understanding and is amenable to plan.  Discharged home in good condition. Final Clinical Impression(s) / ED Diagnoses Final diagnoses:  Exposure to needle, initial encounter    Rx / DC Orders ED Discharge Orders     None         Tonye Pearson, PA-C 09/28/21 [place], DO 09/29/21 1407

## 2021-09-28 NOTE — Discharge Instructions (Signed)
We have sent out a few tests today that test for diseases that can be transmitted through needlesticks.  I would recommend that you repeat the HIV testing in 6 months as you will often not test positive so early on in transmission.  If anything is abnormal you will receive a call.

## 2022-05-30 ENCOUNTER — Other Ambulatory Visit: Payer: Self-pay

## 2022-05-30 ENCOUNTER — Encounter: Payer: Self-pay | Admitting: Internal Medicine

## 2022-05-30 ENCOUNTER — Ambulatory Visit
Admission: EM | Admit: 2022-05-30 | Discharge: 2022-05-30 | Disposition: A | Discharge status: Home / Self Care | Payer: BC Managed Care – PPO | Attending: Internal Medicine | Admitting: Internal Medicine

## 2022-05-30 DIAGNOSIS — M62830 Muscle spasm of back: Secondary | ICD-10-CM

## 2022-05-30 DIAGNOSIS — H6121 Impacted cerumen, right ear: Secondary | ICD-10-CM | POA: Diagnosis not present

## 2022-05-30 DIAGNOSIS — S29011A Strain of muscle and tendon of front wall of thorax, initial encounter: Secondary | ICD-10-CM | POA: Diagnosis not present

## 2022-05-30 DIAGNOSIS — H9201 Otalgia, right ear: Secondary | ICD-10-CM

## 2022-05-30 MED ORDER — TIZANIDINE HCL 4 MG PO CAPS: 4.0000 mg | ORAL_CAPSULE | Freq: Three times a day (TID) | ORAL | 0 refills | Status: AC | PRN

## 2022-05-30 MED ORDER — METHYLPREDNISOLONE 4 MG PO TBPK: ORAL_TABLET | ORAL | 0 refills | Status: AC

## 2022-05-30 NOTE — Discharge Instructions (Signed)
Llame a su medico general para que le re-cheque y ordene terapia ficica en 7 dias

## 2022-05-30 NOTE — ED Provider Notes (Signed)
[facility]    CSN: [id number] Arrival date & time: 05/30/22  1437      History   Chief Complaint Chief Complaint  Patient presents with   Chest Pain    HPI Pam Carter is a 37 y.o. female presents with L rhomboid, trap, R neck and R chest wall pain due to repetitive R arm use at work. She was seen here before for the same problem in January ad was placed on Naproxen and Zanaflex which helped, but the pain resolved a week after she finished the medication. She has not reported it to her job or seen her PCP for FU as mentioned last visit. She denies paresthesia of her R arm. The chest tightness feels superficial and pressing on her R chest provokes the pain. The pain on R upper back and chest is provoked with R arm movement.  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 She states her work load has increased a little compared to past years. She denies an acute injury of areas of pain.  She has been taking Tylenol and Ibuprofen for the past week but only helps a little and gets worse at night time and can't sleep. Her kids have given her massages which helps temporarily.   2- Has ben having R ear pain when she places her ear plugs in her R ear at work. Denies URI or fever    Past Medical History:  Diagnosis Date   Anemia    Constipation     Patient Active Problem List   Diagnosis Date Noted   Constipation 06/19/2010   Abdominal pain 06/19/2010    Past Surgical History:  Procedure Laterality Date   CESAREAN SECTION     x1    OB History   No obstetric history on file.      Home Medications    Prior to Admission medications   Medication Sig Start Date End Date Taking? Authorizing Provider  methylPREDNISolone (MEDROL DOSEPAK) 4 MG TBPK tablet Take as directed 05/30/22  Yes Rodriguez-Southworth, Nettie Elm, PA-C  tiZANidine (ZANAFLEX) 4 MG  capsule Take 1 capsule (4 mg total) by mouth 3 (three) times daily as needed for muscle spasms (tid on days off and qhs when you work). 05/30/22  Yes Rodriguez-Southworth, Nettie Elm, PA-C  hypromellose (GENTEAL) 0.3 % GEL ophthalmic ointment Place into the left eye every 4 (four) hours as needed for dry eyes. 06/10/19   Wurst, [place], PA-C  levothyroxine (SYNTHROID) 50 MCG tablet Take 1 tablet by mouth daily. Stopped it 01/2022 05/24/18   [provider]    Family History Family History  Problem Relation Age of Onset   Colitis Mother 71   Diabetes Father     Social History Social History   Tobacco Use   Smoking status: Never   Smokeless tobacco: Never  Vaping Use   Vaping Use: Never used  Substance Use Topics   Alcohol use: No   Drug use: No     Allergies   Patient has no known allergies.   Review of Systems Review of Systems  Constitutional:  Negative for fever.  Respiratory:  Positive for chest tightness. Negative for cough, shortness of breath and wheezing.   Cardiovascular:  Negative for chest pain and palpitations.  Genitourinary:        Denies breast lumps  Skin:  Negative for rash and wound.  Neurological:  Negative for weakness and numbness.     Physical Exam Triage Vital Signs ED Triage Vitals  Enc Vitals Group     BP 05/30/22 1452 111/72     Pulse Rate 05/30/22 1452 86     Resp 05/30/22 1452 16     Temp 05/30/22 1452 98 F (36.7 C)     Temp Source 05/30/22 1452 Oral     SpO2 05/30/22 1452 98 %     Weight --      Height --      Head Circumference --      Peak Flow --      Pain Score 05/30/22 1453 7     Pain Loc --      Pain Edu? --      Excl. in GC? --    No data found.  Updated Vital Signs BP 111/72 (BP Location: Right Arm)   Pulse 86   Temp 98 F (36.7 C) (Oral)   Resp 16   LMP 05/20/2022 (Exact Date)   SpO2 98%   Visual Acuity Right Eye Distance:   Left Eye Distance:   Bilateral Distance:    Right Eye Near:   Left Eye  Near:    Bilateral Near:     Physical Exam Vitals and nursing note reviewed.  Constitutional:      General: She is not in acute distress.    Appearance: She is not toxic-appearing.  HENT:     Head: Normocephalic.     Right Ear: There is impacted cerumen.     Left Ear: There is no impacted cerumen.     Ears:     Comments: She has wax in her R ear canal that seems to be close to his TM. After lavage the R TM looks healthy and intact.  Eyes:     General: No scleral icterus.    Conjunctiva/sclera: Conjunctivae normal.  Neck:     Comments: NECK- has tense and tender R trapezius and rhomboid muscles. R sternocleidomastoid is also tender. Cardiovascular:     Rate and Rhythm: Normal rate and regular rhythm.     Heart sounds: No murmur heard. Pulmonary:     Effort: Pulmonary effort is normal.     Breath sounds: Normal breath sounds.     Comments: R pectoralis muscle is tender and reproduced her pain Chest:     Chest wall: Tenderness present.  Musculoskeletal:        General: Normal range of motion.     Cervical back: Neck supple.     Comments: R Upper EXTREMITY- dentoid muscle is tender.   Lymphadenopathy:     Cervical: No cervical adenopathy.  Skin:    General: Skin is warm and dry.     Findings: No bruising, erythema or rash.  Neurological:     Mental Status: She is alert and oriented to person, place, and time.     Motor: No weakness.     Gait: Gait normal.     Deep Tendon Reflexes: Reflexes normal.  Psychiatric:        Mood and Affect: Mood normal.        Behavior: Behavior normal.        Thought Content: Thought content normal.        Judgment: Judgment normal.      UC Treatments / Results  Labs (all labs ordered are listed, but only abnormal results are displayed) Labs Reviewed - No data  to display  EKG NSR Normal EKG  Radiology No results found.  Procedures Procedures (including critical care time)  Medications Ordered in UC Medications - No data to  display  Initial Impression / Assessment and Plan / UC Course  I have reviewed the triage vital signs and the nursing notes. R ear lavage was done. I believe the pain could have been from the ear plugs pushing on the wax against her TM since the TM is healthy.   R Otalgia R cerumen impaction  R trapezius/ Rhomboid muscle spasms and strain  I placed her on Medrol and Zanaflex as noted Advised to use ice when she has the acute pain pain after work for 48 h for 20 minutes 3-4 times day and then after that alternate with heat.  Because this is happening from repetitive work, she needs to report it to her boss.  Advised to FU with PCP so she/he can order PT for her. I offered her a work note with restrictions, but she declined.   Final Clinical Impressions(s) / UC Diagnoses   Final diagnoses:  Spasm of right trapezius muscle  Strain of right pectoralis muscle, initial encounter  Impacted cerumen of right ear  Otalgia, right     Discharge Instructions      Llame a su medico general para que le re-cheque y ordene terapia ficica en 7 dias     ED Prescriptions     Medication Sig Dispense Auth. Provider   methylPREDNISolone (MEDROL DOSEPAK) 4 MG TBPK tablet Take as directed 21 tablet Rodriguez-Southworth, Nettie Elm, PA-C   tiZANidine (ZANAFLEX) 4 MG capsule Take 1 capsule (4 mg total) by mouth 3 (three) times daily as needed for muscle spasms (tid on days off and qhs when you work). 60 capsule Rodriguez-Southworth, Nettie Elm, PA-C      PDMP not reviewed this encounter.   Jmya, [place], PA-C 05/30/22 1747

## 2022-05-30 NOTE — ED Triage Notes (Addendum)
Pt states she is having upper back on right side pain and pain in her chest on right side to, that started 7 days ago. Taking ibuprofen and tylenol with little relief of pain. Pt states the chest pain feels like a heavy weight on her chest and back and SOB with exertion. Pt also is having right ear pain that started a week ago.
